# Patient Record
Sex: Female | Born: 1935 | Race: White | Hispanic: No
Health system: Southern US, Community
[De-identification: ages and names within clinical notes are randomized; demographics above are authoritative.]

---

## 2006-10-19 ENCOUNTER — Inpatient Hospital Stay: Payer: Self-pay

## 2006-10-19 ENCOUNTER — Other Ambulatory Visit: Payer: Self-pay

## 2006-10-25 ENCOUNTER — Encounter: Payer: Self-pay | Admitting: Internal Medicine

## 2010-06-27 ENCOUNTER — Ambulatory Visit: Payer: Self-pay | Admitting: Internal Medicine

## 2010-08-17 ENCOUNTER — Inpatient Hospital Stay: Payer: Self-pay | Admitting: Internal Medicine

## 2011-03-12 ENCOUNTER — Inpatient Hospital Stay: Payer: Self-pay | Admitting: Internal Medicine

## 2011-03-14 ENCOUNTER — Ambulatory Visit: Payer: Self-pay | Admitting: Internal Medicine

## 2011-03-14 LAB — COMPREHENSIVE METABOLIC PANEL
Albumin: 1.2 g/dL — ABNORMAL LOW (ref 3.4–5.0)
Anion Gap: 8 (ref 7–16)
BUN: 35 mg/dL — ABNORMAL HIGH (ref 7–18)
Bilirubin,Total: 0.4 mg/dL (ref 0.2–1.0)
Chloride: 105 mmol/L (ref 98–107)
EGFR (African American): >60
Osmolality: 302 (ref 275–301)
Potassium: 3.8 mmol/L (ref 3.5–5.1)
SGOT(AST): 14 U/L — ABNORMAL LOW (ref 15–37)
Sodium: 146 mmol/L — ABNORMAL HIGH (ref 136–145)
Total Protein: 5.9 g/dL — ABNORMAL LOW (ref 6.4–8.2)

## 2011-03-14 LAB — CBC WITH DIFFERENTIAL/PLATELET
Eosinophil %: 0 %
HCT: 31.1 % — ABNORMAL LOW (ref 35.0–47.0)
HGB: 9.9 g/dL — ABNORMAL LOW (ref 12.0–16.0)
Lymphocyte #: 0.7 10*3/uL — ABNORMAL LOW (ref 1.0–3.6)
MCV: 93 fL (ref 80–100)
Monocyte %: 3.8 %
Neutrophil #: 26.6 10*3/uL — ABNORMAL HIGH (ref 1.4–6.5)
Neutrophil %: 93.7 %
Platelet: 485 10*3/uL — ABNORMAL HIGH (ref 150–440)
RBC: 3.33 10*6/uL — ABNORMAL LOW (ref 3.80–5.20)
WBC: 28.4 10*3/uL — ABNORMAL HIGH (ref 3.6–11.0)

## 2011-03-15 LAB — CBC WITH DIFFERENTIAL/PLATELET
Basophil %: 0 %
Eosinophil #: 0 10*3/uL (ref 0.0–0.7)
Eosinophil %: 0 %
HCT: 31.7 % — ABNORMAL LOW (ref 35.0–47.0)
HGB: 10.2 g/dL — ABNORMAL LOW (ref 12.0–16.0)
Lymphocyte %: 1.6 %
MCHC: 32.1 g/dL (ref 32.0–36.0)
MCV: 93 fL (ref 80–100)
Monocyte #: 0.9 10*3/uL — ABNORMAL HIGH (ref 0.0–0.7)
Monocyte %: 3.7 %
Neutrophil #: 24.5 10*3/uL — ABNORMAL HIGH (ref 1.4–6.5)
Neutrophil %: 94.7 %
RBC: 3.41 10*6/uL — ABNORMAL LOW (ref 3.80–5.20)
WBC: 25.9 10*3/uL — ABNORMAL HIGH (ref 3.6–11.0)

## 2011-03-15 LAB — VANCOMYCIN, TROUGH: Vancomycin, Trough: 12 ug/mL (ref 10–20)

## 2011-03-15 LAB — BASIC METABOLIC PANEL
BUN: 35 mg/dL — ABNORMAL HIGH (ref 7–18)
Calcium, Total: 8.5 mg/dL (ref 8.5–10.1)
Creatinine: 0.63 mg/dL (ref 0.60–1.30)
EGFR (Non-African Amer.): >60
Glucose: 179 mg/dL — ABNORMAL HIGH (ref 65–99)
Osmolality: 301 (ref 275–301)
Potassium: 3.8 mmol/L (ref 3.5–5.1)
Sodium: 145 mmol/L (ref 136–145)

## 2011-04-14 ENCOUNTER — Ambulatory Visit: Payer: Self-pay | Admitting: Internal Medicine

## 2011-04-14 DEATH — deceased

## 2013-03-16 IMAGING — CT CT CHEST W/O CM
1 series · 15 of 33 positions shown, 19 images · non-contrast
Comparison: 08/17/2010

REASON FOR EXAM: pneumonia/right empyema
COMMENTS:

PROCEDURE:     CT  - CT CHEST WITHOUT CONTRAST  - March 12, 2011  [DATE]
RESULT:     Indication: Pneumonia
TECHNIQUE: Multiple axial images of the chest are obtained without
intravenous contrast.

[Series 2: soft tissue · axial · 0.65mm/px · z∈[-714,-439]mm · 15 of 65 slices shown, 19 images]
[im 5/65  mediastinal]
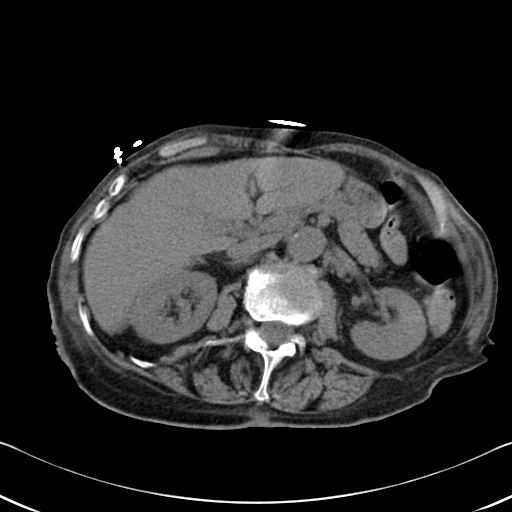
[im 5/65  lung]
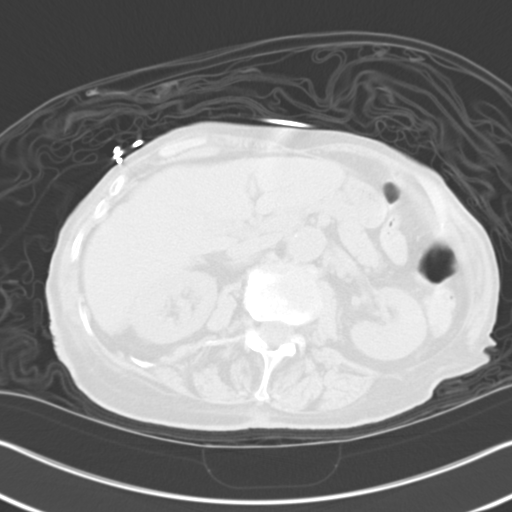
[im 10/65  lung]
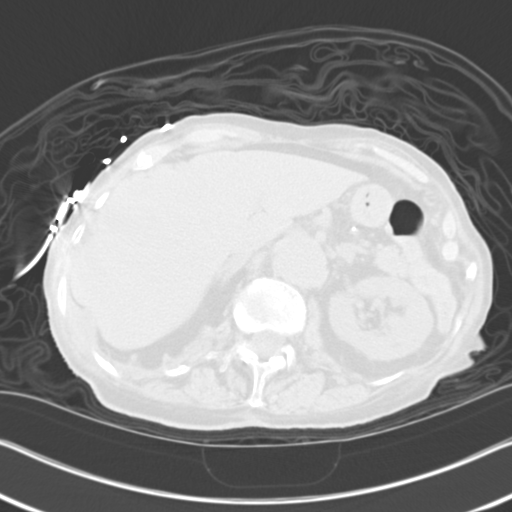
[im 13/65  lung]
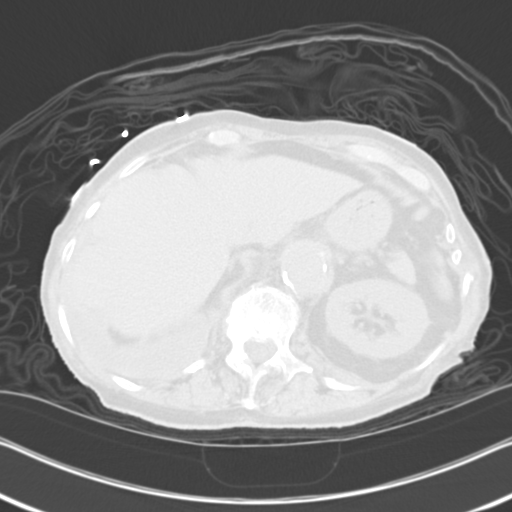
[im 17/65  lung]
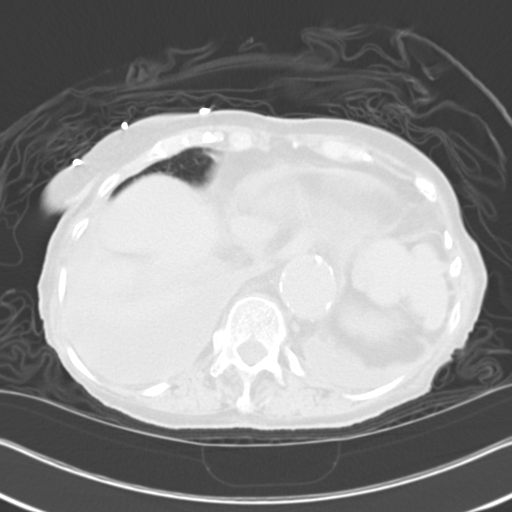
[im 22/65  mediastinal]
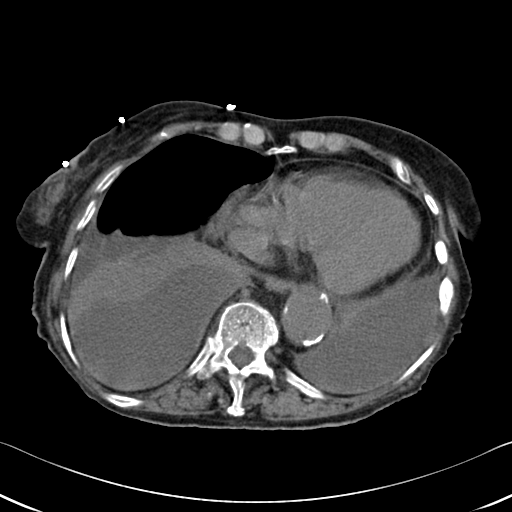
[im 22/65  lung]
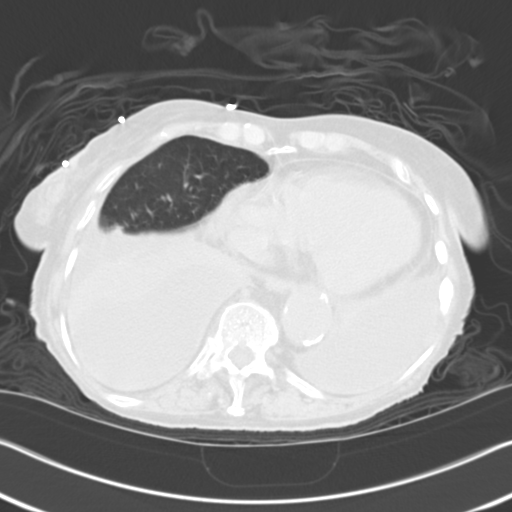
[im 26/65  lung]
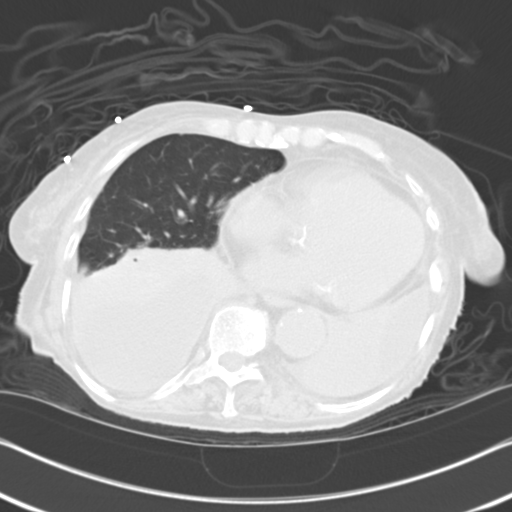
[im 29/65  lung]
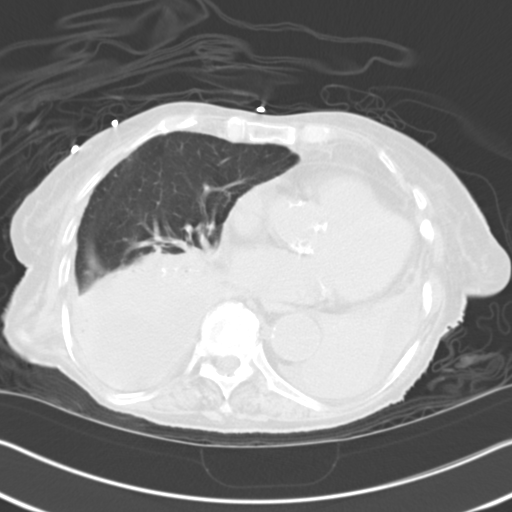
[im 34/65  lung]
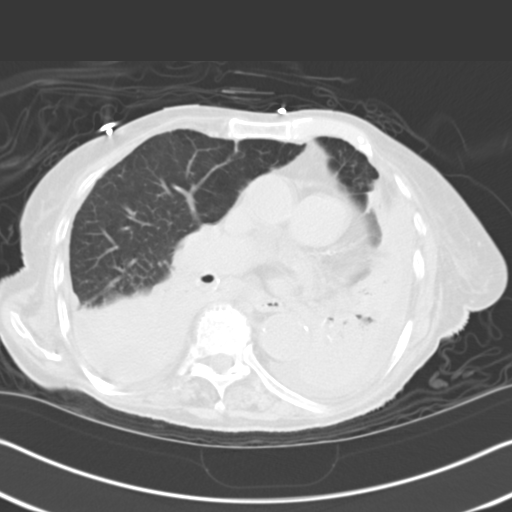
[im 36/65  mediastinal]
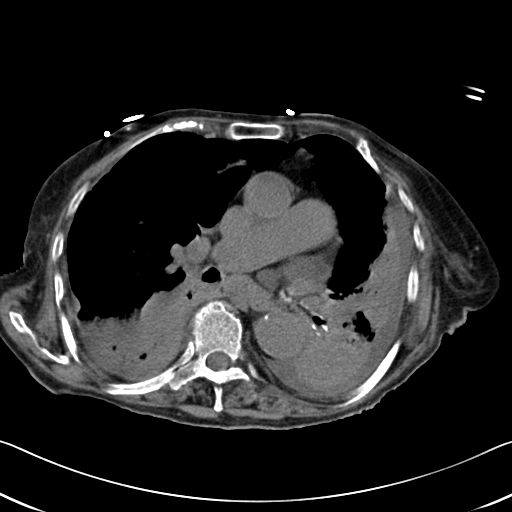
[im 36/65  lung]
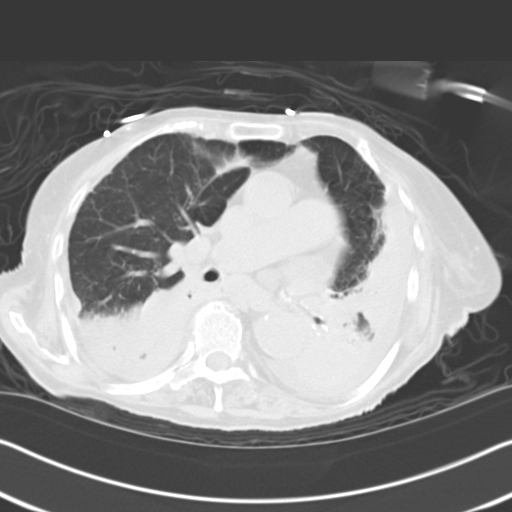
[im 39/65  lung]
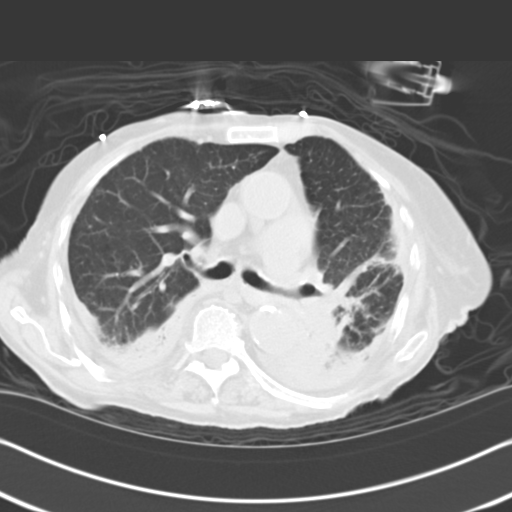
[im 43/65  lung]
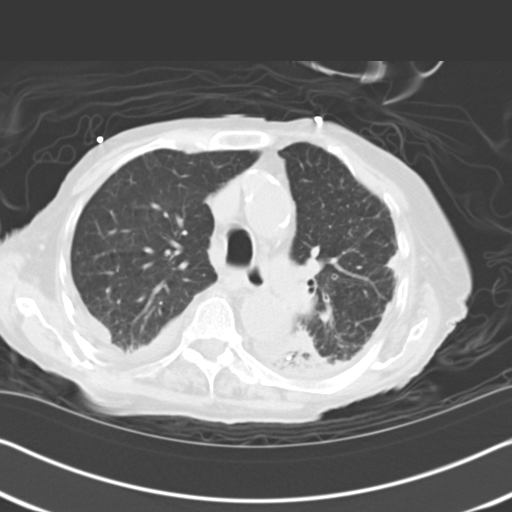
[im 48/65  lung]
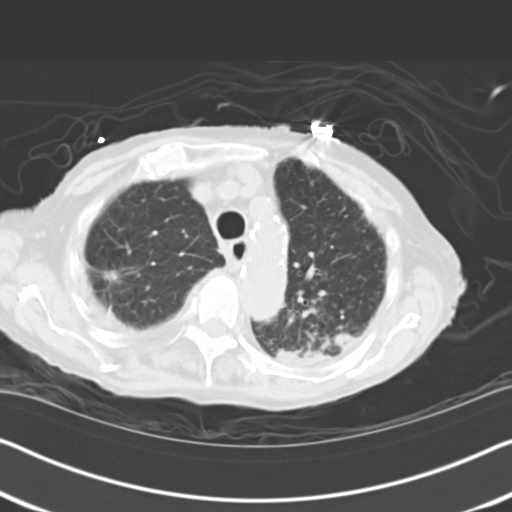
[im 52/65  mediastinal]
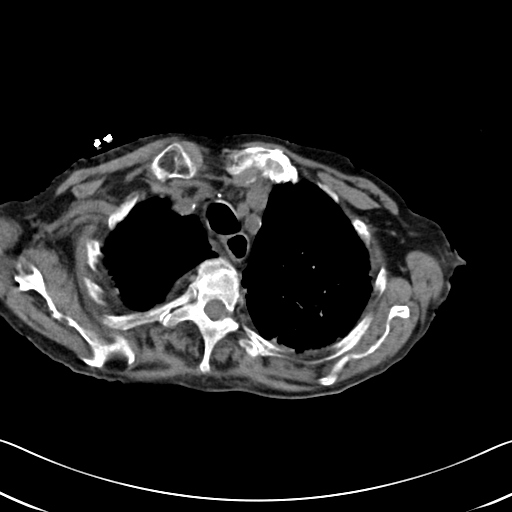
[im 52/65  lung]
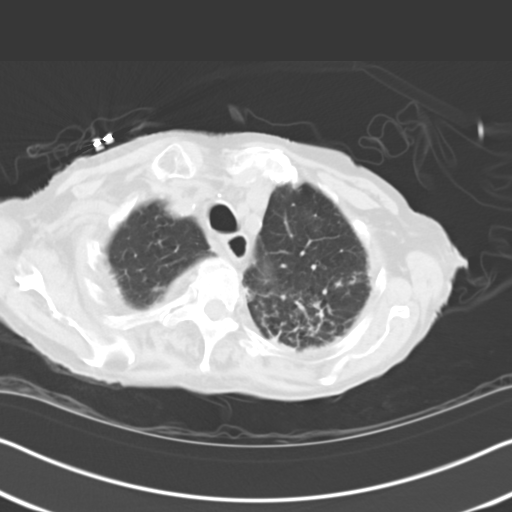
[im 55/65  lung]
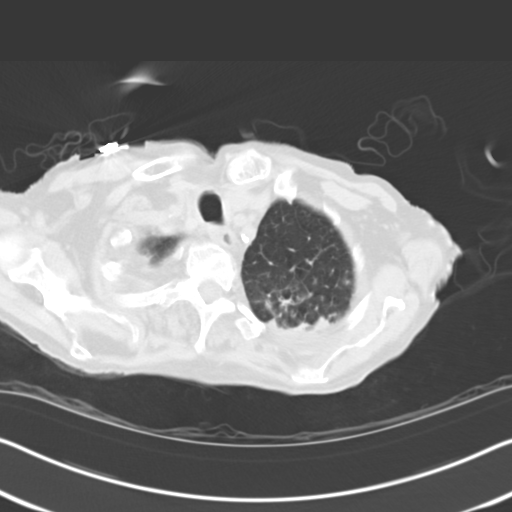
[im 60/65  lung]
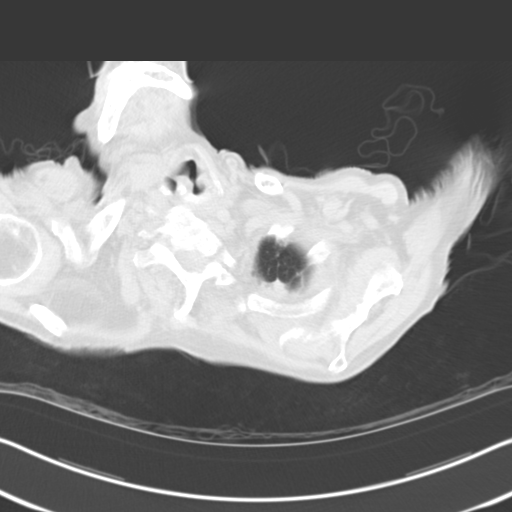

[15 of 33 positions shown; findings below may reference images not displayed]

FINDINGS: The central airways are patent. There is left upper lobe airspace
disease.There are small bilateral pleural effusions with associated airspace
disease which may represent atelectasis versus pneumonia.. There may be an
element of loculation of the right pleural effusion.

There are no pathologically enlarged axillary, hilar, or mediastinal lymph
nodes.

The heart size is enlarged. There is no pericardial effusion. There is mild
descending thoracic aortic aneurysmal dilatation measuring 3.7cm in diameter
at the level of the diaphragmatic hiatus.  There is coronary artery
atherosclerosis involving the LAD, circumflex and right coronary artery.

Review of bone windows demonstrates no focal lytic or sclerotic lesions.

Limited noncontrast images of the upper abdomen were obtained. The adrenal
glands appear normal. The remainder of the visualized abdominal organs are
unremarkable.
IMPRESSION: 1. There has been interval development of bilateral pleural effusions and
bilateral lower lobe airspace disease which may  represent atelectasis
versus pneumonia. There is interval development of left upper lobe airspace
disease concerning for pneumonia.

2. Coronary artery disease.

## 2014-07-05 NOTE — Consult Note (Signed)
Comments   I met with pt's daughter, [employer]. Daughter says that she agrees mother is unable to care for herself at home and seems relieved that pt told me this as well. Pt's clinical status remains poor. Will reassess in AM. If pt has not improved, will likely transfer to the [facility]. Daughter is in agreement with this.  entered for chaplain to complete HCPOA. Daughter confirms that pt wants to be a DNR. Will complete out-of-facility DNR form.   Electronic Signatures: Jalissa Heinzelman, Izora Gala (MD)  (Signed 02-Jan-13 18:03)  Authored: Palliative Care   Last Updated: 02-Jan-13 18:03 by Andriana Casa, Izora Gala (MD)

## 2014-07-05 NOTE — Discharge Summary (Signed)
PATIENT NAME:  Marissa Henry, Bryelle N MR#:  [id number] DATE OF BIRTH:  10/28/1916  DATE OF ADMISSION:  03/12/2011 DATE OF DISCHARGE:  03/16/2011  HISTORY OF PRESENT ILLNESS: Ms. Marissa Henry is a 81 year old white lady with known severe oxygen dependent COPD who presented to the Emergency Room with a one week history of increasing shortness of breath, cough, and yellow sputum. She had had a poor appetite and had poor oral intake. In the Emergency Room, she was noted to be in rather severe respiratory distress and was placed on a BiPAP.   PAST MEDICAL HISTORY:  1. Chronic obstructive pulmonary disease that is oxygen dependent.  2. History of diabetes. 3. Hypothyroidism.  4. Osteoporosis.  5. Chronic anxiety.   PAST SURGICAL HISTORY:  1. Tonsillectomy.  2. Appendectomy.  3. Surgery for nephrolithiasis. 4. Hernia surgery. 5. Left hip fracture repair.   ALLERGIES: No known drug allergies.   MEDICATIONS ON ADMISSION:  1. Advair inhaler 250/50 1 puff every 12 hours.  2. Alprazolam 0.25 mg t.i.d. p.r.n.  3. Atrovent 1 puff every six hours. 4. Protonix 40 mg daily.  5. Synthroid 125 mcg daily.  6. Ultram 50 mg every six hours as needed.  7. Xopenex inhaler p.r.n.   SOCIAL HISTORY: She continued to smoke up until six months prior to admission.   ADMISSION PHYSICAL EXAMINATION: Temperature 98.7, heart rate 107, respiratory rate 32, blood pressure 139/75. In general, this is an elderly cachectic appearing lady who was in acute respiratory distress. Mucous membranes were dry. Auscultation of the lungs revealed a few scattered wheezes bilaterally. There were bronchial breath sounds in the right base. The remainder of the exam as described by the admitting physician was unremarkable.   LABORATORY DATA: Admission CBC showed a hemoglobin of 11.4 with a hematocrit of 34.8. Platelet count was 542,000. White count was 43,800. Admission comprehensive metabolic panel showed a random blood sugar of 103, a BUN of  36, a creatinine of 0.89, CO2 of 34, an alkaline phosphatase of 164, and an albumin of 1.7 but was otherwise unremarkable. BNP was 674. Iron was 14. Serial troponins were normal. Blood cultures drawn on admission eventually showed no growth. Admission EKG showed a sinus tachycardia with a right bundle branch block. There were T wave abnormalities consistent with inferolateral ischemia. Arterial blood gas showed a pH of 7.39, a pCO2 of 61, a pO2 of 67, and an FiO2 of 55%. Echocardiogram showed normal left ventricular ejection fraction. There was mild mitral regurgitation and mild tricuspid regurgitation. There was mild valvular aortic stenosis. Admission chest x-ray showed moderate bilateral pleural effusions with possible superimposed pulmonary edema. This was actually felt to be chronic fibrotic changes clinically. Chest CT without contrast showed pleural effusions. There was lower lobe airspace disease which was felt to represent either atelectasis or pneumonia. There was also the possibility of pneumonia in the left upper lobe.   HOSPITAL COURSE: The patient was admitted to the regular medical floor where she was started on intensive pulmonary toilet of IV antibiotics, IV steroids, and SVNs. Despite these measures, the patient failed to improve. A follow-up chest x-ray after three days showed no acute infiltrates but the old infiltrates were unchanged. In view of her poor prognosis, a Palliative Care consult was obtained and after discussion with the family and the patient, it was decided that it was best to transfer her to the Hospice home for comfort care.   DISCHARGE DIAGNOSES:  1. Acute respiratory failure secondary to pneumonia.  2. Acute on  chronic respiratory failure secondary to chronic obstructive pulmonary disease.   ____________________________ Marissa Henry. Danne Harbor, MD [id number] D: 04/12/2011 20:52:21 ET T: 04/13/2011 09:19:20 ET JOB#: [id number]  cc: Jonny Ruiz B. Danne Harbor, MD, <Dictator> Elmo Putt III MD ELECTRONICALLY SIGNED 04/13/2011 17:18
